# Patient Record
Sex: Female | Born: 2005 | Race: White | Hispanic: No | Marital: Single | Smoking: Never smoker
Health system: Southern US, Community
[De-identification: ages and names within clinical notes are randomized; demographics above are authoritative.]

## PROBLEM LIST (undated history)

## (undated) DIAGNOSIS — K921 Melena: Secondary | ICD-10-CM

## (undated) HISTORY — PX: INCOMPLETE CIRCUMCISION REPAIR: SHX484

## (undated) HISTORY — DX: Melena: K92.1

---
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
## 2023-05-15 ENCOUNTER — Encounter (INDEPENDENT_AMBULATORY_CARE_PROVIDER_SITE_OTHER): Payer: Self-pay

## 2023-07-05 ENCOUNTER — Other Ambulatory Visit (INDEPENDENT_AMBULATORY_CARE_PROVIDER_SITE_OTHER): Payer: Self-pay | Admitting: Pediatric Endocrinology

## 2023-07-05 DIAGNOSIS — Z789 Other specified health status: Secondary | ICD-10-CM

## 2024-02-13 ENCOUNTER — Other Ambulatory Visit (INDEPENDENT_AMBULATORY_CARE_PROVIDER_SITE_OTHER): Payer: Self-pay | Admitting: Pediatric Endocrinology

## 2024-02-13 DIAGNOSIS — Z789 Other specified health status: Secondary | ICD-10-CM
